# Patient Record
Sex: Male | Born: 1970 | Race: White | Hispanic: No | Marital: Single | Smoking: Never smoker
Health system: Southern US, Community
[De-identification: ages and names within clinical notes are randomized; demographics above are authoritative.]

## PROBLEM LIST (undated history)

## (undated) DIAGNOSIS — G8929 Other chronic pain: Secondary | ICD-10-CM

## (undated) DIAGNOSIS — G919 Hydrocephalus, unspecified: Secondary | ICD-10-CM

---

## 2004-12-19 ENCOUNTER — Ambulatory Visit (HOSPITAL_COMMUNITY): Admission: RE | Admit: 2004-12-19 | Discharge: 2004-12-19 | Payer: Self-pay | Admitting: Neurosurgery

## 2017-06-09 ENCOUNTER — Emergency Department (HOSPITAL_BASED_OUTPATIENT_CLINIC_OR_DEPARTMENT_OTHER): Payer: Medicare Other

## 2017-06-09 ENCOUNTER — Emergency Department (HOSPITAL_BASED_OUTPATIENT_CLINIC_OR_DEPARTMENT_OTHER)
Admission: EM | Admit: 2017-06-09 | Discharge: 2017-06-09 | Disposition: A | Discharge status: Home / Self Care | Payer: Medicare Other | Attending: Emergency Medicine | Admitting: Emergency Medicine

## 2017-06-09 ENCOUNTER — Other Ambulatory Visit: Payer: Self-pay

## 2017-06-09 ENCOUNTER — Encounter (HOSPITAL_BASED_OUTPATIENT_CLINIC_OR_DEPARTMENT_OTHER): Payer: Self-pay | Admitting: *Deleted

## 2017-06-09 DIAGNOSIS — R079 Chest pain, unspecified: Secondary | ICD-10-CM

## 2017-06-09 DIAGNOSIS — Z982 Presence of cerebrospinal fluid drainage device: Secondary | ICD-10-CM | POA: Diagnosis not present

## 2017-06-09 DIAGNOSIS — Z91040 Latex allergy status: Secondary | ICD-10-CM | POA: Insufficient documentation

## 2017-06-09 DIAGNOSIS — B349 Viral infection, unspecified: Secondary | ICD-10-CM | POA: Insufficient documentation

## 2017-06-09 HISTORY — DX: Other chronic pain: G89.29

## 2017-06-09 HISTORY — DX: Hydrocephalus, unspecified: G91.9

## 2017-06-09 LAB — TROPONIN I: Troponin I: <0.03 ng/mL (ref <0.03)

## 2017-06-09 LAB — CBC WITH DIFFERENTIAL/PLATELET
BASOS ABS: 0 10*3/uL (ref 0.0–0.1)
BASOS PCT: 0 %
EOS ABS: 0.3 10*3/uL (ref 0.0–0.7)
Eosinophils Relative: 4 %
HEMATOCRIT: 38.8 % — AB (ref 39.0–52.0)
Hemoglobin: 12.8 g/dL — ABNORMAL LOW (ref 13.0–17.0)
Lymphocytes Relative: 41 %
Lymphs Abs: 2.8 10*3/uL (ref 0.7–4.0)
MCH: 28.8 pg (ref 26.0–34.0)
MCHC: 33 g/dL (ref 30.0–36.0)
MCV: 87.4 fL (ref 78.0–100.0)
MONOS PCT: 11 %
Monocytes Absolute: 0.8 10*3/uL (ref 0.1–1.0)
NEUTROS ABS: 3 10*3/uL (ref 1.7–7.7)
NEUTROS PCT: 44 %
Platelets: 203 10*3/uL (ref 150–400)
RBC: 4.44 MIL/uL (ref 4.22–5.81)
RDW: 15.6 % — ABNORMAL HIGH (ref 11.5–15.5)
WBC: 6.9 10*3/uL (ref 4.0–10.5)

## 2017-06-09 LAB — COMPREHENSIVE METABOLIC PANEL
ALBUMIN: 3.6 g/dL (ref 3.5–5.0)
ALK PHOS: 72 U/L (ref 38–126)
ALT: 38 U/L (ref 17–63)
ANION GAP: 8 (ref 5–15)
AST: 36 U/L (ref 15–41)
BUN: 29 mg/dL — AB (ref 6–20)
CO2: 25 mmol/L (ref 22–32)
Calcium: 8.6 mg/dL — ABNORMAL LOW (ref 8.9–10.3)
Chloride: 105 mmol/L (ref 101–111)
Creatinine, Ser: 0.73 mg/dL (ref 0.61–1.24)
GFR calc Af Amer: >60 mL/min (ref >60)
GFR calc non Af Amer: >60 mL/min (ref >60)
GLUCOSE: 102 mg/dL — AB (ref 65–99)
Potassium: 3.8 mmol/L (ref 3.5–5.1)
SODIUM: 138 mmol/L (ref 135–145)
Total Bilirubin: 0.5 mg/dL (ref 0.3–1.2)
Total Protein: 7 g/dL (ref 6.5–8.1)

## 2017-06-09 NOTE — ED Notes (Signed)
Pt updated with wait time. 

## 2017-06-09 NOTE — ED Provider Notes (Signed)
[facility] EMERGENCY DEPARTMENT Provider Note   CSN: [id number] Arrival date & time: 06/09/17  1600     History   Chief Complaint Chief Complaint  Patient presents with  . Influenza    HPI Thomas Arnold is a 47 y.o. male.  Patient with history of chronic pain, hydrocephalus with VP shunt, multiple episodes of pneumonia--presents to the emergency department with chest pain, fatigue, chills, and cough ongoing over the past 2 days.  Patient went to [facility] last night and had labs drawn but left before being seen.  No nausea or vomiting or diarrhea.  No headache or confusion.  This was corroborated by family at bedside.  No skin rashes or urinary symptoms.  No treatments prior to arrival.  Patient continues to take his chronic medications. The onset of this condition was acute. The course is constant. Aggravating factors: none. Alleviating factors: none.        Past Medical History:  Diagnosis Date  . Chronic pain   . Hydrocephalus     There are no active problems to display for this patient.   he histories are not reviewed yet. Please review them in the "History" navigator section and refresh this SmartLink.     Home Medications    Prior to Admission medications   Not on File    Family History History reviewed. No pertinent family history.  Social History Social History   Tobacco Use  . Smoking status: Never Smoker  . Smokeless tobacco: Never Used  Substance Use Topics  . Alcohol use: No    Frequency: Never  . Drug use: No     Allergies   Demerol [meperidine]; Fentanyl; Latex; Morphine and related; and Motrin [ibuprofen]   Review of Systems Review of Systems  Constitutional: Positive for chills and fatigue. Negative for fever.  HENT: Positive for congestion. Negative for ear pain, rhinorrhea, sinus pressure and sore throat.   Eyes: Negative for redness.  Respiratory: Positive for cough. Negative for shortness of breath and  wheezing.   Gastrointestinal: Negative for abdominal pain, diarrhea, nausea and vomiting.  Genitourinary: Negative for dysuria.  Musculoskeletal: Negative for myalgias and neck stiffness.  Skin: Negative for rash.  Neurological: Negative for headaches.  Hematological: Negative for adenopathy.  Psychiatric/Behavioral: Negative for confusion.     Physical Exam Updated Vital Signs BP 111/71 (BP Location: Right Arm)   Pulse 67   Temp 98.4 F (36.9 C) (Oral)   Resp 18   Ht 5\' 7"  (1.702 m)   Wt 63.5 kg (140 lb)   SpO2 99%   BMI 21.93 kg/m   Physical Exam  Constitutional: He is oriented to person, place, and time. He appears well-developed and well-nourished.  HENT:  Head: Atraumatic.  Right Ear: Tympanic membrane, external ear and ear canal normal.  Left Ear: Tympanic membrane, external ear and ear canal normal.  Nose: Nose normal. No mucosal edema or rhinorrhea.  Mouth/Throat: Uvula is midline, oropharynx is clear and moist and mucous membranes are normal. Mucous membranes are not dry. No trismus in the jaw. No uvula swelling. No oropharyngeal exudate, posterior oropharyngeal edema, posterior oropharyngeal erythema or tonsillar abscesses.  Non-normocephalic head shape noted.  Eyes: Conjunctivae are normal. Right eye exhibits no discharge. Left eye exhibits no discharge.  Neck: Normal range of motion. Neck supple.  Cardiovascular: Normal rate, regular rhythm and normal heart sounds.  No murmur heard. Pulmonary/Chest: Effort normal and breath sounds normal. No respiratory distress. He has no wheezes. He has no  rales.  Abdominal: Soft. There is no tenderness. There is no rebound and no guarding.  Musculoskeletal: He exhibits no tenderness.  Severe scoliosis noted  Neurological: He is alert and oriented to person, place, and time. No cranial nerve deficit or sensory deficit. He exhibits normal muscle tone.  Patient at his mental baseline per family at bedside.  Skin: Skin is warm  and dry.  Psychiatric: He has a normal mood and affect.  Nursing note and vitals reviewed.    ED Treatments / Results  Labs (all labs ordered are listed, but only abnormal results are displayed) Labs Reviewed  CBC WITH DIFFERENTIAL/PLATELET - Abnormal; Notable for the following components:      Result Value   Hemoglobin 12.8 (*)    HCT 38.8 (*)    RDW 15.6 (*)    All other components within normal limits  COMPREHENSIVE METABOLIC PANEL - Abnormal; Notable for the following components:   Glucose, Bld 102 (*)    BUN 29 (*)    Calcium 8.6 (*)    All other components within normal limits  TROPONIN I    EKG  EKG Interpretation  Date/Time:  Tuesday June 09 2017 20:54:06 EST Ventricular Rate:  59 PR Interval:  112 QRS Duration: 102 QT Interval:  412 QTC Calculation: 407 R Axis:   0 Text Interpretation:  Sinus bradycardia Left ventricular hypertrophy Possible Lateral infarct , age undetermined Possible Inferior infarct , age undetermined Abnormal ECG No prior ECG for comparison.  T wave inversion in leadas 3, AVF, V2, V3 No STEMI Confirmed by Theda Belfastegeler, Chris ([id number]) on 06/09/2017 11:40:52 PM       Radiology Dg Chest 2 View  Result Date: 06/09/2017 CLINICAL DATA:  Acute onset of cough and chills.  Diarrhea. EXAM: CHEST  2 VIEW COMPARISON:  None. FINDINGS: The lungs are well-aerated. Mild scarring is noted at the left lung base. There is no evidence of pleural effusion or pneumothorax. The heart is normal in size; the mediastinal contour is within normal limits. No acute osseous abnormalities are seen. Right convex thoracic scoliosis is noted. A right-sided ventriculoperitoneal shunt is partially imaged. IMPRESSION: 1. Mild scarring at the left lung base.  Lungs otherwise clear. 2. Right convex thoracic scoliosis noted. Electronically Signed   By: Roanna RaiderJeffery  Chang M.D.   On: 06/09/2017 21:19    Procedures Procedures (including critical care time)  Medications Ordered in  ED Medications - No data to display   Initial Impression / Assessment and Plan / ED Course  I have reviewed the triage vital signs and the nursing notes.  Pertinent labs & imaging results that were available during my care of the patient were reviewed by me and considered in my medical decision making (see chart for details).     Patient seen and examined.  Suspect viral illness, however patient does not have documented fever.  Given chest symptoms, will check chest x-ray and EKG.  I reviewed labs from yesterday's visit [facility].  Mild leukocytosis at that time.  Vital signs reviewed and are as follows: BP 111/71 (BP Location: Right Arm)   Pulse 67   Temp 98.4 F (36.9 C) (Oral)   Resp 18   Ht 5\' 7"  (1.702 m)   Wt 63.5 kg (140 lb)   SpO2 99%   BMI 21.93 kg/m   Patient has abnormal EKG, no old for comparison.  Given that chest pain is been ongoing for greater than 24 hours, feel that one troponin will properly  evaluate for ACS, although I feel this is low probability.  Also, will check basic lab work and compare to previous.  Labs are reassuring with improvement in leukocytosis.  Troponin was negative.  Patient has been stable during ED stay.  Encourage patient to return to the emergency department with worsening symptoms including high persistent fever, trouble breathing, chest pain, persistent vomiting, new symptoms or other concerns.  Also discussed signs of VP shunt malfunction including severe headache, confusion, vomiting and he needs to return if these occur.  Patient and family verbalized understanding and agree with plan.  They will use over-the-counter medications.  Final Clinical Impressions(s) / ED Diagnoses   Final diagnoses:  Viral syndrome  Chest pain, unspecified type   Patient with viral type symptoms without documented fever over the past 2 days.  He has had some chest pain which is likely related to cough.  EKG is abnormal however troponin is  negative here.  Patient with no signs of tachycardia or hypoxia.  Chest pain will be atypical for PE.  No signs of meningitis or VP shunt malfunction.  Patient is at his mental baseline without confusion or vomiting.  At this time, will treat conservatively with symptomatic control.  Return instructions as above.  Patient appears well.   ED Discharge Orders    None       Renne Crigler, Cordelia Poche 06/09/17 1610    Tegeler, Canary Brim, MD 06/10/17 [phone]

## 2017-06-09 NOTE — Discharge Instructions (Signed)
Please read and follow all provided instructions.  Your diagnoses today include:  1. Viral syndrome   2. Chest pain, unspecified type     Tests performed today include:  Chest x-ray -no pneumonia  Blood counts and electrolytes  Blood test for heart muscle damage or heart attack -negative  EKG  Vital signs. See below for your results today.   Medications prescribed:   None  Take any prescribed medications only as directed.  Home care instructions:  Follow any educational materials contained in this packet. Please continue drinking plenty of fluids. Use over-the-counter cold and flu medications as needed as directed on packaging for symptom relief. You may also use ibuprofen or tylenol as directed on packaging for pain or fever.   BE VERY CAREFUL not to take multiple medicines containing Tylenol (also called acetaminophen). Doing so can lead to an overdose which can damage your liver and cause liver failure and possibly death.   Follow-up instructions: Please follow-up with your primary care provider in the next 3 days for further evaluation of your symptoms.   Return instructions:   Please return to the Emergency Department if you experience worsening symptoms.  Please return if you have a high fever greater than 101 degrees not controlled with over-the-counter medications, persistent vomiting and cannot keep down fluids, or worsening trouble breathing.  Please return if you have any other emergent concerns.  Additional Information:  Your vital signs today were: BP 111/71 (BP Location: Right Arm)    Pulse 67    Temp 98.4 F (36.9 C) (Oral)    Resp 18    Ht 5\' 7"  (1.702 m)    Wt 63.5 kg (140 lb)    SpO2 99%    BMI 21.93 kg/m  If your blood pressure (BP) was elevated above 135/85 this visit, please have this repeated by your doctor within one month.

## 2017-06-09 NOTE — ED Notes (Signed)
Patient transported to X-ray 

## 2017-06-09 NOTE — ED Notes (Signed)
While in waiting room pt took personal morphine and gabapentin for pain

## 2017-06-09 NOTE — ED Notes (Signed)
Pa made aware of results

## 2017-06-09 NOTE — ED Triage Notes (Signed)
Pt c/o flu like symptoms x 2 days denies n/v/d

## 2017-06-09 NOTE — ED Notes (Signed)
ED Provider at bedside. 

## 2019-01-16 IMAGING — DX DG CHEST 2V
2 series · 2 of 2 positions shown · non-contrast
Comparison: None.

CLINICAL DATA: Acute onset of cough and chills.  Diarrhea.

EXAM:
CHEST  2 VIEW

[chest pa]
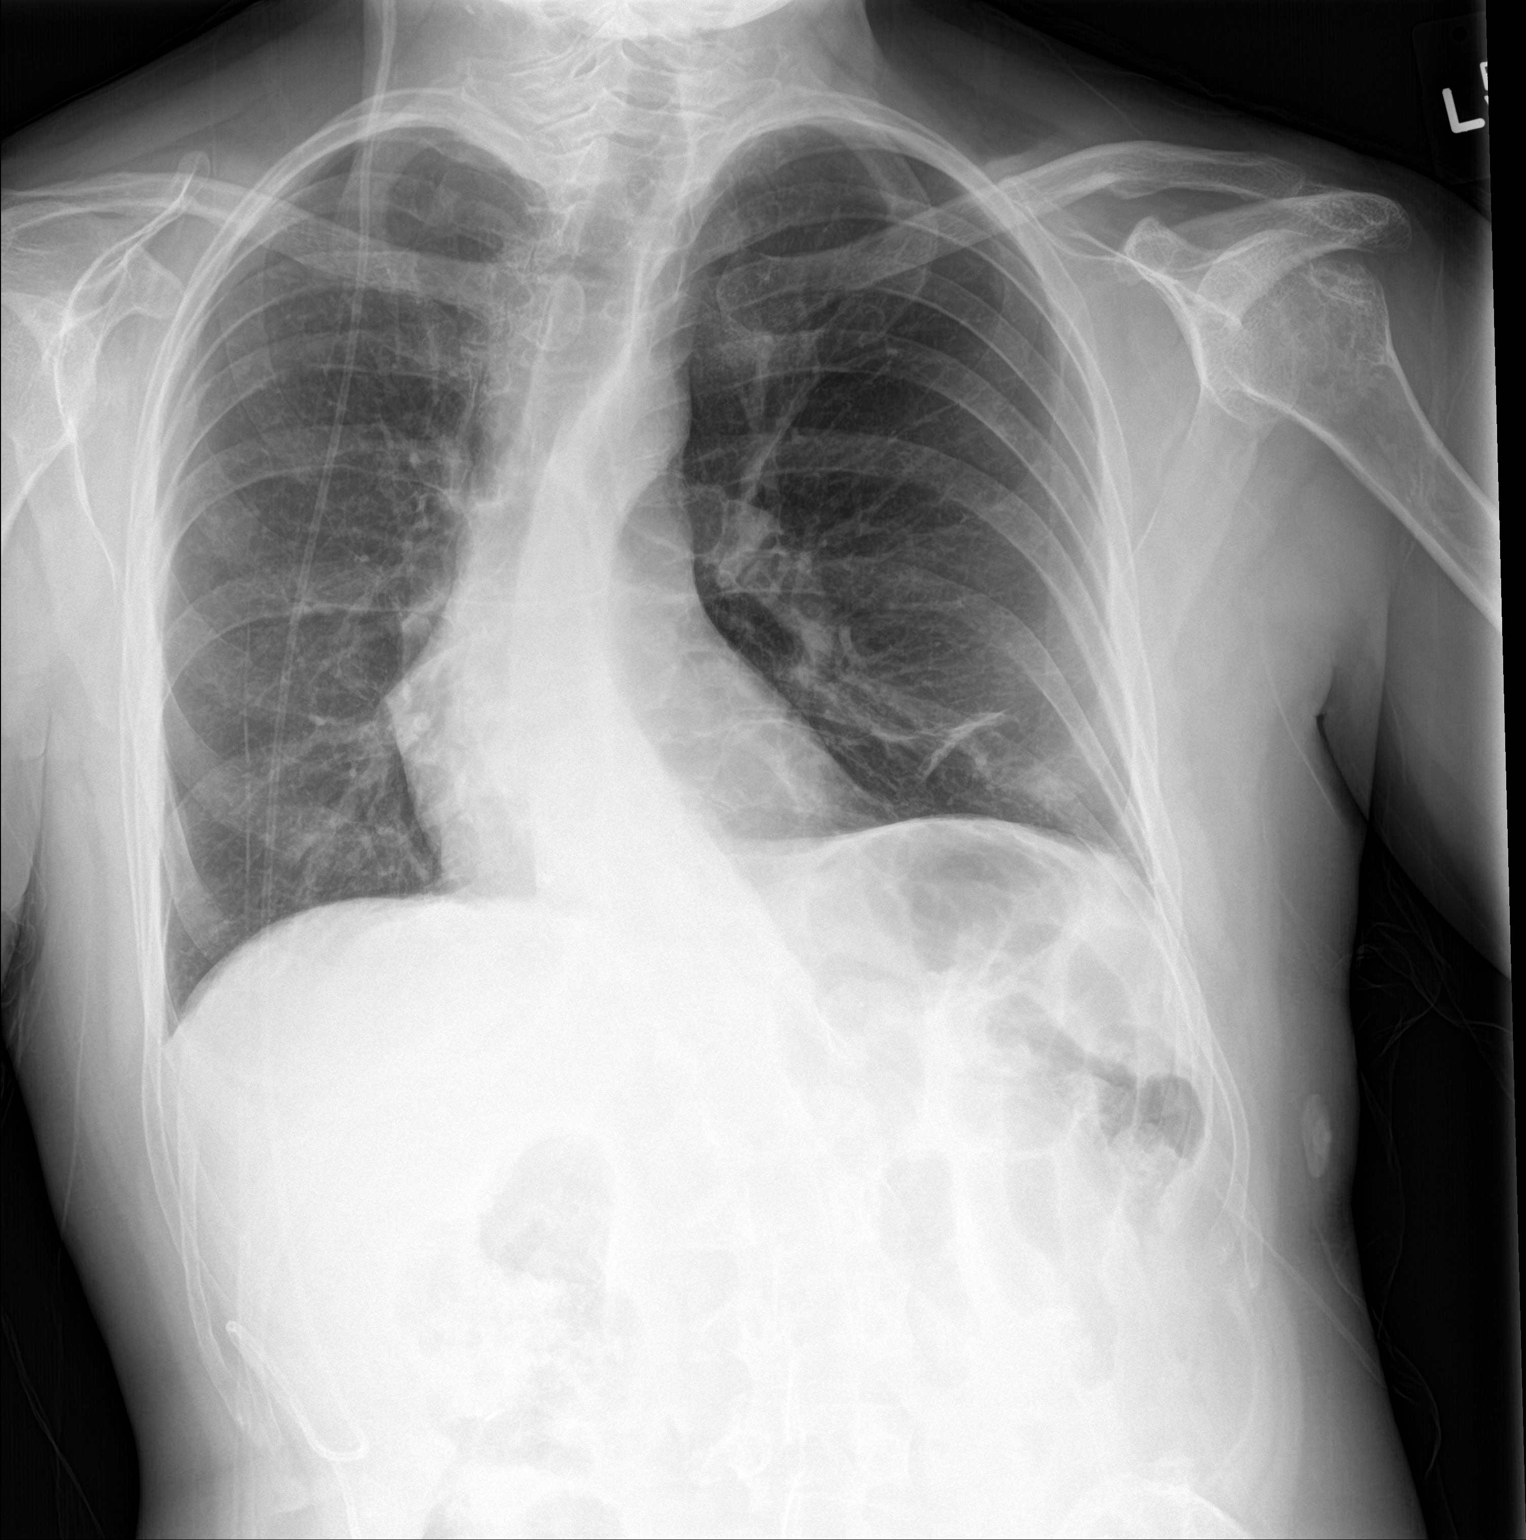

[chest lat]
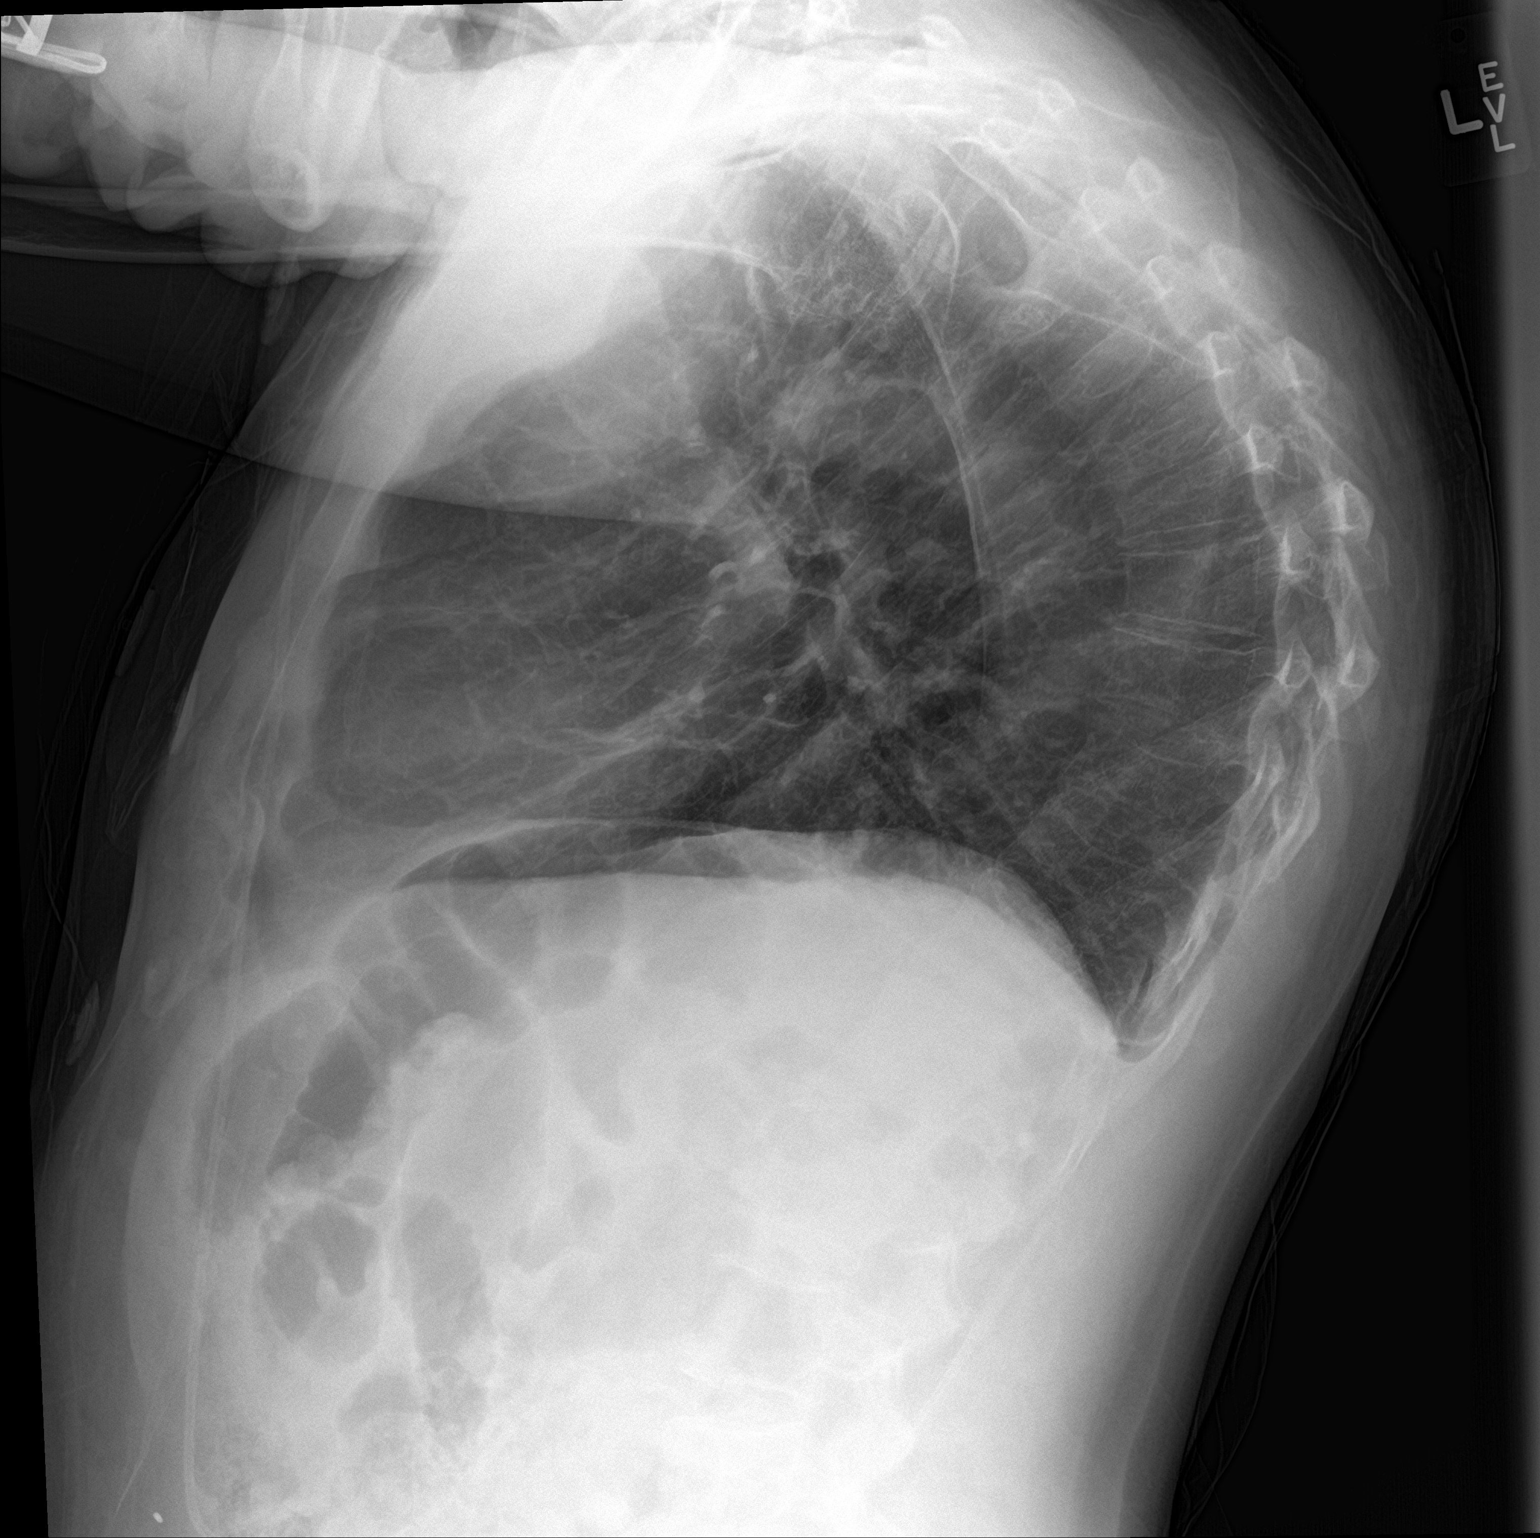

[2 of 2 positions shown; findings below may reference images not displayed]

FINDINGS: The lungs are well-aerated. Mild scarring is noted at the left lung
base. There is no evidence of pleural effusion or pneumothorax.

The heart is normal in size; the mediastinal contour is within
normal limits. No acute osseous abnormalities are seen. Right convex
thoracic scoliosis is noted. A right-sided ventriculoperitoneal
shunt is partially imaged.
IMPRESSION: 1. Mild scarring at the left lung base.  Lungs otherwise clear.
2. Right convex thoracic scoliosis noted.
